# Patient Record
Sex: Female | Born: 1965 | Hispanic: Refuse to answer
Health system: Southern US, Community
[De-identification: ages and names within clinical notes are randomized; demographics above are authoritative.]

---

## 2000-10-26 ENCOUNTER — Emergency Department (HOSPITAL_COMMUNITY): Admission: EM | Admit: 2000-10-26 | Discharge: 2000-10-26 | Payer: Self-pay | Admitting: Emergency Medicine

## 2000-10-26 ENCOUNTER — Encounter: Payer: Self-pay | Admitting: Pediatrics

## 2003-04-04 ENCOUNTER — Inpatient Hospital Stay (HOSPITAL_COMMUNITY): Admission: AD | Admit: 2003-04-04 | Discharge: 2003-04-04 | Payer: Self-pay | Admitting: Obstetrics and Gynecology

## 2003-04-15 ENCOUNTER — Encounter: Admission: RE | Admit: 2003-04-15 | Discharge: 2003-04-15 | Payer: Self-pay | Admitting: Obstetrics & Gynecology

## 2004-07-04 ENCOUNTER — Emergency Department (HOSPITAL_COMMUNITY): Admission: AC | Admit: 2004-07-04 | Discharge: 2004-07-04 | Payer: Self-pay | Admitting: Emergency Medicine

## 2004-07-13 ENCOUNTER — Encounter: Admission: RE | Admit: 2004-07-13 | Discharge: 2004-07-13 | Payer: Self-pay | Admitting: Family Medicine

## 2006-05-17 ENCOUNTER — Other Ambulatory Visit: Admission: RE | Admit: 2006-05-17 | Discharge: 2006-05-17 | Payer: Self-pay | Admitting: Obstetrics and Gynecology

## 2006-05-19 ENCOUNTER — Encounter: Admission: RE | Admit: 2006-05-19 | Discharge: 2006-05-19 | Payer: Self-pay | Admitting: Obstetrics and Gynecology

## 2006-09-10 ENCOUNTER — Inpatient Hospital Stay (HOSPITAL_COMMUNITY): Admission: RE | Admit: 2006-09-10 | Discharge: 2006-09-14 | Payer: Self-pay | Admitting: Obstetrics and Gynecology

## 2006-09-11 ENCOUNTER — Encounter (INDEPENDENT_AMBULATORY_CARE_PROVIDER_SITE_OTHER): Payer: Self-pay | Admitting: Obstetrics and Gynecology

## 2009-07-27 ENCOUNTER — Emergency Department (HOSPITAL_BASED_OUTPATIENT_CLINIC_OR_DEPARTMENT_OTHER): Admission: EM | Admit: 2009-07-27 | Discharge: 2009-07-28 | Payer: Self-pay | Admitting: Emergency Medicine

## 2009-07-28 ENCOUNTER — Ambulatory Visit: Payer: Self-pay | Admitting: Diagnostic Radiology

## 2010-03-14 ENCOUNTER — Encounter: Payer: Self-pay | Admitting: Obstetrics and Gynecology

## 2010-05-10 LAB — URINALYSIS, ROUTINE W REFLEX MICROSCOPIC
Hgb urine dipstick: NEGATIVE
Nitrite: NEGATIVE
Specific Gravity, Urine: 1.033 — ABNORMAL HIGH (ref 1.005–1.030)
Urobilinogen, UA: 1 mg/dL (ref 0.0–1.0)
pH: 6 (ref 5.0–8.0)

## 2010-05-10 LAB — POCT B-TYPE NATRIURETIC PEPTIDE (BNP): B Natriuretic Peptide, POC: <5 pg/mL (ref 0–100)

## 2010-05-10 LAB — BASIC METABOLIC PANEL
BUN: 16 mg/dL (ref 6–23)
CO2: 25 mEq/L (ref 19–32)
Chloride: 105 mEq/L (ref 96–112)
Creatinine, Ser: 0.8 mg/dL (ref 0.4–1.2)
Glucose, Bld: 111 mg/dL — ABNORMAL HIGH (ref 70–99)

## 2010-05-10 LAB — CBC
MCHC: 33.2 g/dL (ref 30.0–36.0)
MCV: 89.1 fL (ref 78.0–100.0)
Platelets: 261 10*3/uL (ref 150–400)
RDW: 14.2 % (ref 11.5–15.5)

## 2010-05-10 LAB — URINE MICROSCOPIC-ADD ON

## 2010-05-10 LAB — DIFFERENTIAL
Basophils Absolute: 0.1 10*3/uL (ref 0.0–0.1)
Basophils Relative: 1 % (ref 0–1)
Eosinophils Absolute: 0.1 10*3/uL (ref 0.0–0.7)
Monocytes Relative: 11 % (ref 3–12)
Neutrophils Relative %: 74 % (ref 43–77)

## 2010-05-10 LAB — POCT CARDIAC MARKERS
CKMB, poc: <1 ng/mL — ABNORMAL LOW (ref 1.0–8.0)
CKMB, poc: <1 ng/mL — ABNORMAL LOW (ref 1.0–8.0)
Troponin i, poc: <0.05 ng/mL (ref 0.00–0.09)

## 2010-05-10 LAB — URINE CULTURE

## 2010-07-06 NOTE — H&P (Signed)
Kayla Mccormick, Kayla Mccormick               ACCOUNT NO.:  [id number]   MEDICAL RECORD NO.:  [id number]          PATIENT TYPE:  AMB   LOCATION:  SDC                           [facility]ACILITY:  [facility]   PHYSICIAN:  Gerald Leitz, MD          DATE OF BIRTH:  1965-04-28   DATE OF ADMISSION:  09/10/2006  DATE OF DISCHARGE:                              HISTORY & PHYSICAL   HISTORY OF PRESENT ILLNESS:  This is a 45 year old G3, P3 with  symptomatic uterine fibroids and anemia.  She had a hemoglobin checked  on September 07, 2006 that was 7.3.  She desires management of her fibroids  via total abdominal hysterectomy.   PAST OB HISTORY:  Spontaneous vaginal delivery x2.  Cesarean section x1.   GYN HISTORY:  STDs; HSV2. Last Pap smear was March 2008.  This showed  low grade squamous intraepithelial lesion. She underwent a colposcopy  with benign endocervical curettings, no biopsy was taken.   PAST MEDICAL HISTORY:  Uterine fibroids and anemia.   PAST SURGICAL HISTORY:  C-section x1, abdominal hernia repair and  oophorectomy.   MEDICATIONS:  The patient is noncompliant with iron sulfate.   ALLERGIES:  No known drug allergies.   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  She denies  tobacco, alcohol or illicit drug use.   FAMILY HISTORY:  Father with leukemia. Negative for breast, ovarian,  colon cancer.   REVIEW OF SYSTEMS:  Negative except as stated in history of current  illness.   PHYSICAL EXAM:  VITAL SIGNS: Blood pressure 132/74, weight 134 pounds.  GENERAL:  Alert and oriented, no acute distress.  CARDIOVASCULAR: Regular rate and rhythm.  LUNGS: Clear to auscultation bilaterally.  ABDOMEN:  Soft and nontender, nondistended.  Positive bowel sounds.  EXTREMITIES: No clubbing, cyanosis or edema.  PELVIC:  Normal external female genitalia. No vulvar or vaginal,  cervical lesions are noted.  Bimanual exam reveals approximately 18-week  size uterus.  No adnexal masses or tenderness.   IMPRESSION AND PLAN:  Symptomatic uterine fibroids and anemia.   PLAN:  Total abdominal hysterectomy.  The patient will be admitted on  September 10, 2006 for transfusion prior to surgery on the 21st. Risks,  benefits, alternatives of surgery and transfusion were discussed with  the patient including  but not limited to infection, bleeding, damage to bowel, bladder or  surrounding organs with the need for further surgery, risk of  transfusion, HIV, hepatitis B and C were discussed.  The patient  understands risks desires to proceed with total abdominal hysterectomy.      Gerald Leitz, MD  Electronically Signed     TC/MEDQ  D:  09/07/2006  T:  09/07/2006  Job:  [id number]

## 2010-07-06 NOTE — Op Note (Signed)
NAMEOBERIA, BEAUDOIN NO.:  [id number]   MEDICAL RECORD NO.:  [id number]          PATIENT TYPE:  INP   LOCATION:  9319                          FACILITY:  [facility]   PHYSICIAN:  Gerald Leitz, MD          DATE OF BIRTH:  10/25/1965   DATE OF PROCEDURE:  09/11/2006  DATE OF DISCHARGE:                               OPERATIVE REPORT   PREOPERATIVE DIAGNOSIS:  Symptomatic uterine fibroids.   POSTOPERATIVE DIAGNOSIS:  Symptomatic uterine fibroids plus enterocele.   PROCEDURE:  Total abdominal hysterectomy and enterocele repair.   SURGEON:  Gerald Leitz, M.D.   ASSISTANT:  Bing Neighbors. Sydnee Cabal, M.D.   ANESTHESIA:  General.   SPECIMENS:  Uterus sent to pathology.   ESTIMATED BLOOD LOSS:  150 mL.   FLUIDS:  2000 mL of lactated Ringer's.   URINE OUTPUT:  400 mL of clear urine at the end of the procedure.   COMPLICATIONS:  None.   FINDINGS:  Large 18 weeks size fibroid uterus. The right ovary fallopian  tube appeared normal.  The left ovary was absent.   PROCEDURE:  The patient taken to the operating room where she was placed  under general anesthesia. She was prepped and draped in the usual  sterile fashion and midline incision was made with scalpel, carried down  to the underlying fascia.  The fascia was incised and this incision was  extended superiorly inferiorly with Mayo scissors. Peritoneum was  identified and entered sharply with Metzenbaum scissors.  The incision  was extended superiorly with good visualization of the bladder.  The  abdomen was examined with findings noted above.  Balfour retractor was  placed into the abdominal cavity.  Bowel packed away with moist  laparotomy sponges.  The cornu the uterus were grasped with Kelly clamps  and used for retraction.  The round ligaments were suture ligated with 0  Vicryl bilaterally and then transected with electrocautery.  This  incision was extended to the midline with Metzenbaum scissors to create  a bladder  flap on the right. The utero-ovarian ligament was clamped  doubly clamped, transected, suture ligated with 0 Vicryl then  infundibulopelvic ligament on the left was identified, clamped,  transected, suture ligated with 0 Vicryl. Uterine arteries were  skeletonized.  A bladder flap was dissected off the lower uterine  segment and cervix with Metzenbaum scissors and a sponge stick. Uterine  artery on the right was clamped with Heaney clamp, transected, suture  ligated with 0 Vicryl.  This was repeated on the left uterine artery.  The cardinal ligament was clamped with Heaney clamps, transected, suture  ligated with 0 Vicryl.  Uterosacrals were clamped with Heaney clamps,  transected, suture ligated with 0 Vicryl. The uterus and cervix  amputated with Jorgenson scissors.  The vaginal cuff angles were  repaired with 0 Vicryl.  The vaginal cuff was reapproximated with a  running stitch of 0 Vicryl.  The abdomen was copiously irrigated.  The  patient was noted to have some bleeding at the area of bladder  dissection.  This was made hemostatic with interrupted suture of  2-0  Vicryl. The abdomen was then copiously irrigated.  Excellent hemostasis  was noted. All instruments were removed from the abdomen. Fascia was  reapproximated with 0 PDS. The skin was closed with staples.  Sponge,  lap, needle counts were correct x2.  The patient was given 2 grams of  Ancef prior to the surgery.  She wakened from anesthesia, taken to  recovery room awake and stable condition.      Gerald Leitz, MD  Electronically Signed     TC/MEDQ  D:  09/11/2006  T:  09/11/2006  Job:  [id number]

## 2010-07-09 NOTE — Group Therapy Note (Signed)
Kayla Mccormick, Kayla Mccormick                         ACCOUNT NO.:  [id number]   MEDICAL RECORD NO.:  [id number]                   PATIENT TYPE:  OUT   LOCATION:  [facility]                           FACILITY:  [facility]   PHYSICIAN:  Elsie Lincoln, MD                   DATE OF BIRTH:  August 12, 1965   DATE OF SERVICE:  04/15/2003                                    CLINIC NOTE   REASON FOR VISIT:  The patient is a 45 year old G3 P3-0-0-3 who presents for  follow-up from [facility].  The patient was seen at an urgent care center early  February and had a Bartholin's abscess drained.  She returned to the [facility] on  April 04, 2003 with reaccumulation of the abscess.  A Ward catheter was  placed and the patient has steadily gotten better since then.  She was  placed on doxycycline and has 1 day left of this.   PAST MEDICAL HISTORY:  Denies.   PAST SURGICAL HISTORY:  1. Two ovarian cystectomies - one in 1981 and one in a year that she cannot     remember when.  2. A hernia repair in 1997.  3. A C-section in 1997 with a concomitant BTL.   GYNECOLOGICAL HISTORY:  Two SVDs and one C-section and one BTL.  Last Pap  smear February 2003 at Dr. Elliot Gault' office.  She no longer has a GYN since  Dr. Elliot Gault has left the community.   SOCIAL HISTORY:  No smoking, drugs, or alcohol.   ALLERGIES:  No known drug allergies.   REVIEW OF SYSTEMS:  No chest pain, shortness of breath, nausea, vomiting,  diarrhea, fevers, or perineal pain.   PHYSICAL EXAMINATION:  Temperature 98.7, pulse 89, blood pressure 114/76,  weight 140.2.  External genitalia:  Tanner V.  A Ward catheter is in place  in the left labia with the drain against the vagina.  There is no erythema,  pus, or tenderness in this area.   ASSESSMENT AND PLAN:  43. A 45 year old female, last menstrual period April 07, 2003 for follow-     up of Bartholin's gland abscess.  The Bartholin's abscess is healing     well.  Will wait between 4-6 weeks before pulling  the catheter.     Hopefully it will just fall out at the point it becomes epithelialized.     The patient needs to come back in 3 weeks for evaluation.  2. The patient needs to find a permanent GYN.  She does not want to have     follow-up care here for her Pap smear and annual exams.  The patient was     told that she needs a Pap smear this year as it has been since 2003 since     her last Pap smear.  Elsie Lincoln, MD   KL/MEDQ  D:  04/15/2003  T:  04/15/2003  Job:  [id number]

## 2010-07-09 NOTE — Discharge Summary (Signed)
[name], Kayla Mccormick               ACCOUNT NO.:  [id number]   MEDICAL RECORD NO.:  [id number]          PATIENT TYPE:  INP   LOCATION:  9319                          [facility]ILITY[facility]   PHYSICIAN:  Gerald Leitz, MD          DATE OF BIRTH:  08-Apr-1965   DATE OF ADMISSION:  09/10/2006  DATE OF DISCHARGE:  09/14/2006                               DISCHARGE SUMMARY   ADMISSION DIAGNOSIS:  1. Anemia.  2. Uterine fibroids   BRIEF HOSPITAL COURSE:  The patient was admitted on September 10, 2006 with  anemia of hemoglobin was 7.3.  She received 2 units packed red blood  cells.  Her post transfusion hematocrit and hemoglobin were 31.7 and 9.8  respectively.  She underwent a total abdominal hysterectomy on September 11, 2006. She did well postoperatively. Hemoglobin on postop day #1 was  10.1, hematocrit of 32.6. She is discharged on postop day #3 with return  of bladder function.  She will follow up with Dr. Richardson Dopp in 4-6 weeks.   MEDICATIONS AT DISCHARGE:  Motrin, Percocet.   DISCHARGE DIAGNOSIS:  Anemia, uterine fibroids, status post total  abdominal hysterectomy.      Gerald Leitz, MD  Electronically Signed     TC/MEDQ  D:  10/10/2006  T:  10/10/2006  Job:  [id number]

## 2010-11-28 IMAGING — CR DG CHEST 2V
2 series · 2 of 2 positions shown · non-contrast
Comparison: None.

CLINICAL DATA: Left-sided chest pressure with tingling.

CHEST - 2 VIEW

[w chest pa]
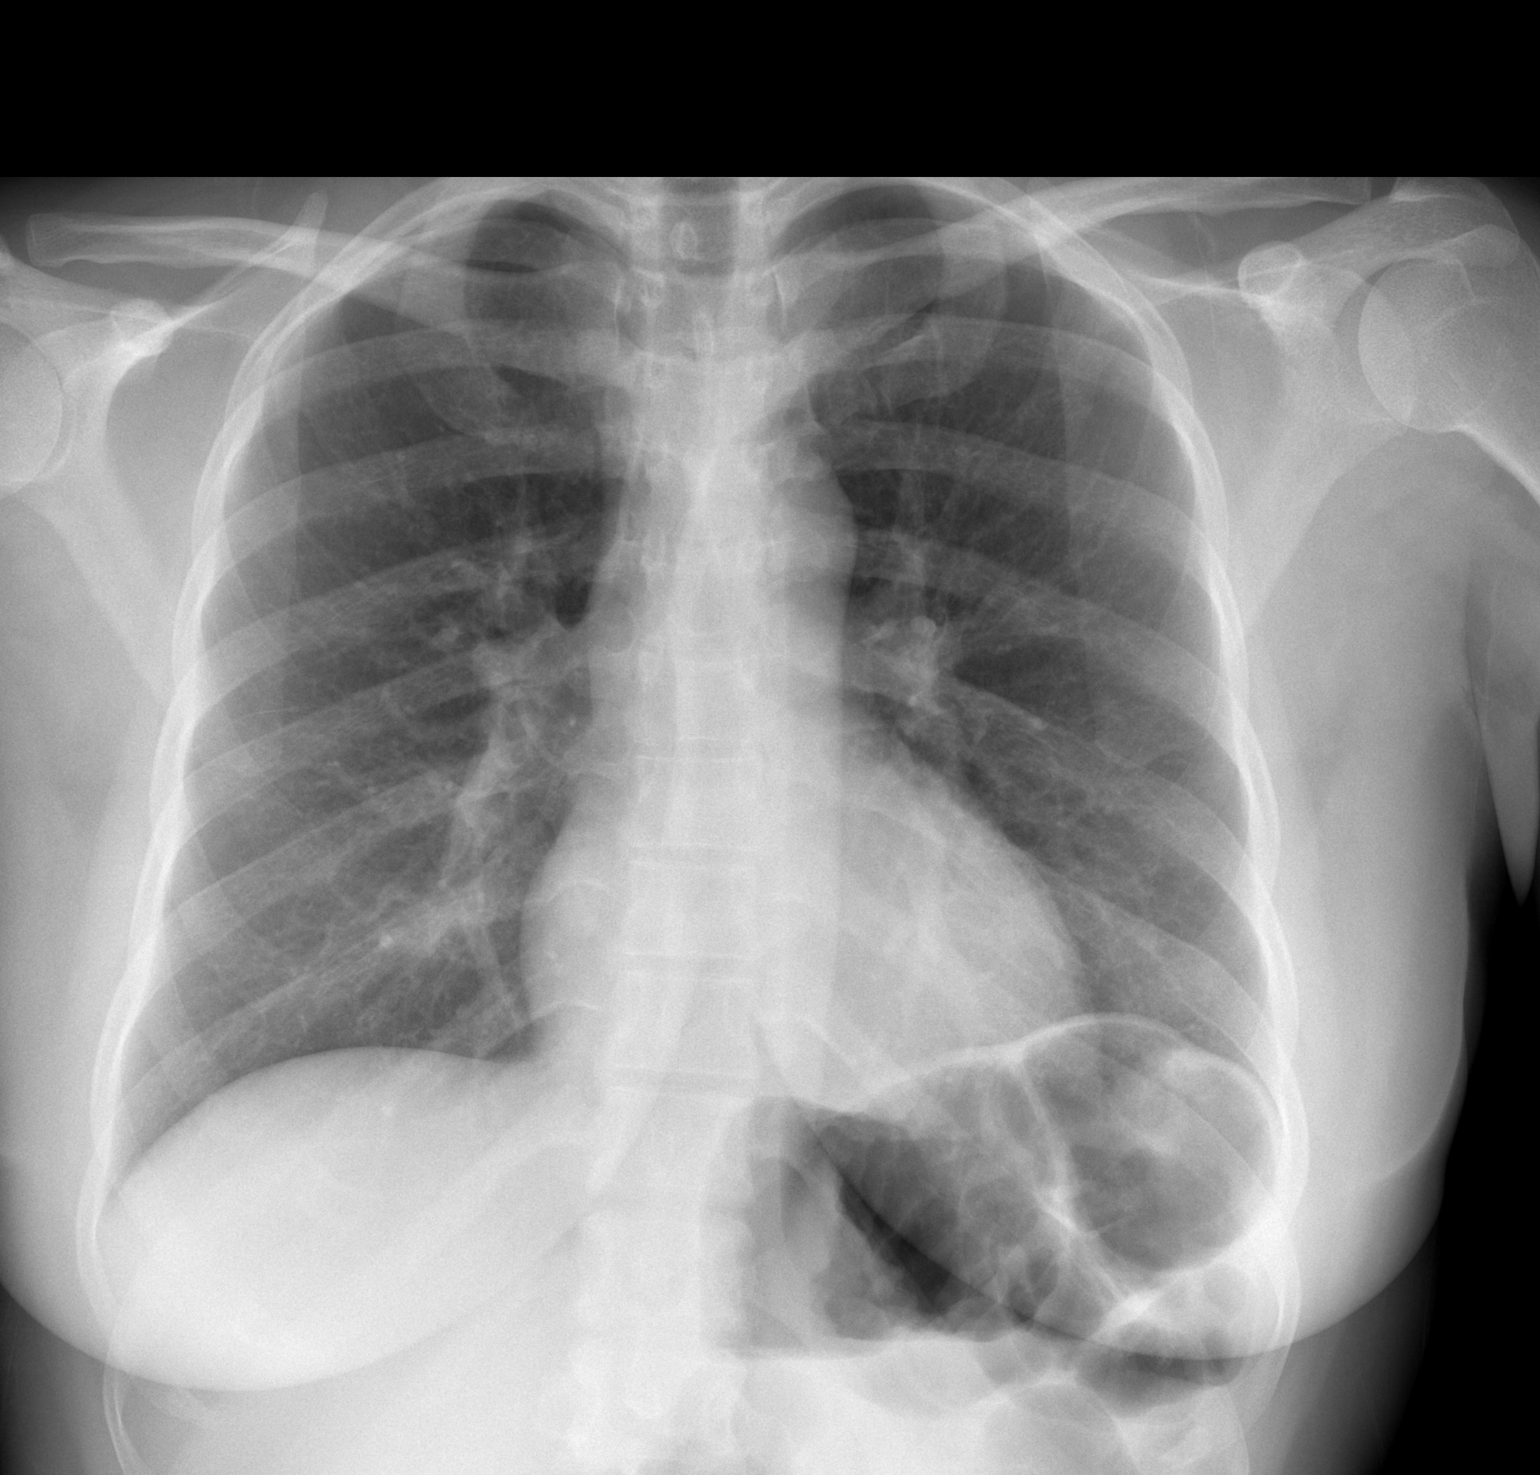

[w chest lat]
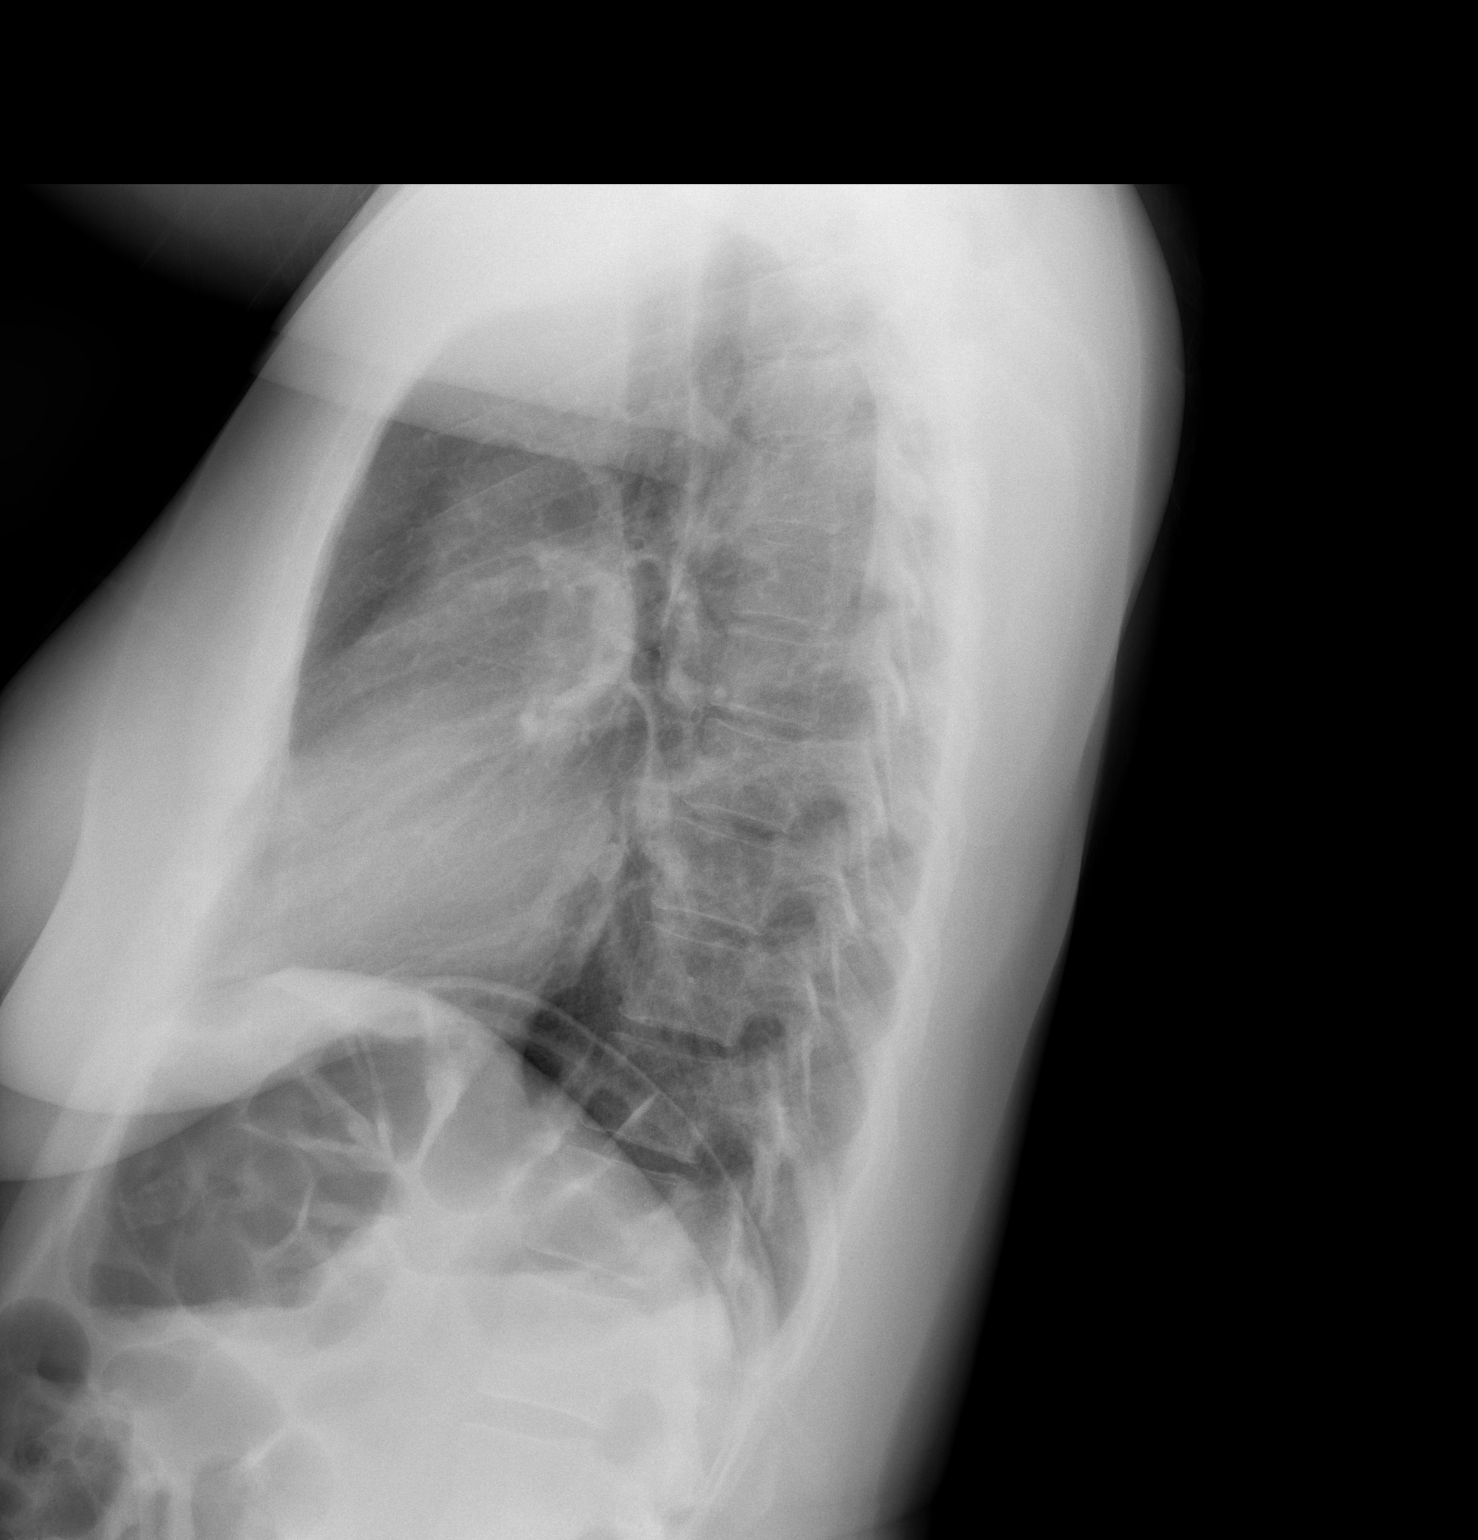

[2 of 2 positions shown; findings below may reference images not displayed]

FINDINGS: Midline trachea.  Normal heart size and mediastinal
contours. No pleural effusion or pneumothorax.
Clear lungs.
IMPRESSION: No acute cardiopulmonary disease.

## 2010-11-28 IMAGING — CT CT HEAD W/O CM
1 series · 16 of 30 positions shown, 20 images · non-contrast
Comparison: Head CT [DATE] of six.

CLINICAL DATA: Headache with tingling in both hands.

CT HEAD WITHOUT CONTRAST
TECHNIQUE: Contiguous axial images were obtained from the base of
the skull through the vertex without contrast.

[Series 2: head 4.8 h37s · axial · 0.42mm/px · z∈[+1104,+1237]mm · 16 of 32 slices shown, 20 images]
[im 2/32  brain]
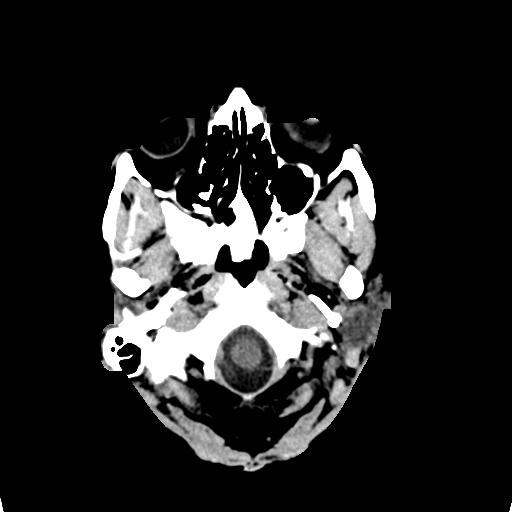
[im 2/32  bone]
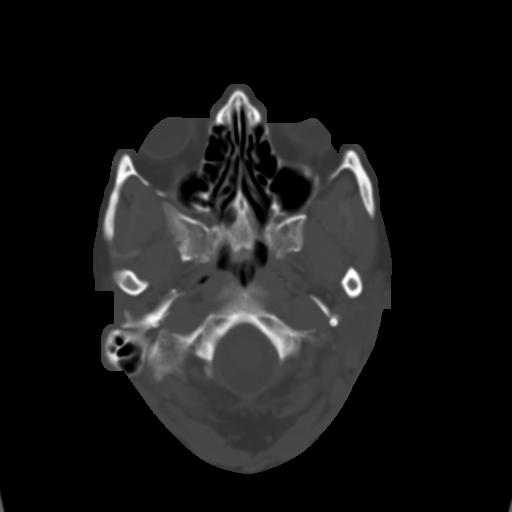
[im 4/32  brain]
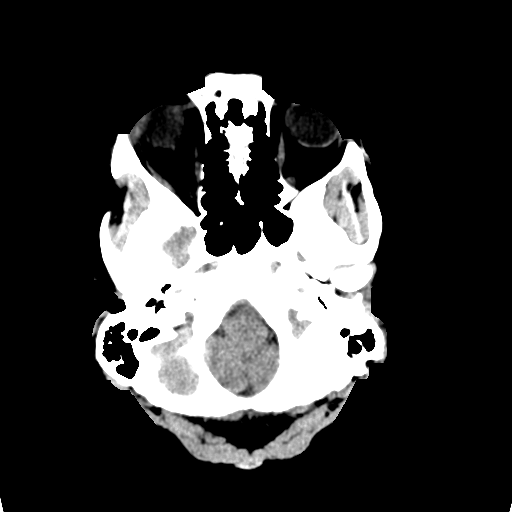
[im 6/32  brain]
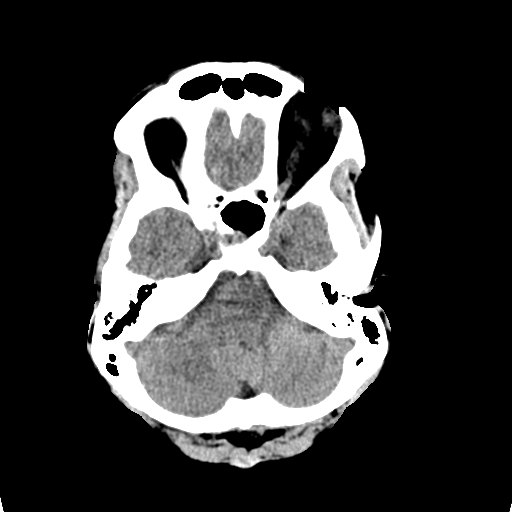
[im 8/32  brain]
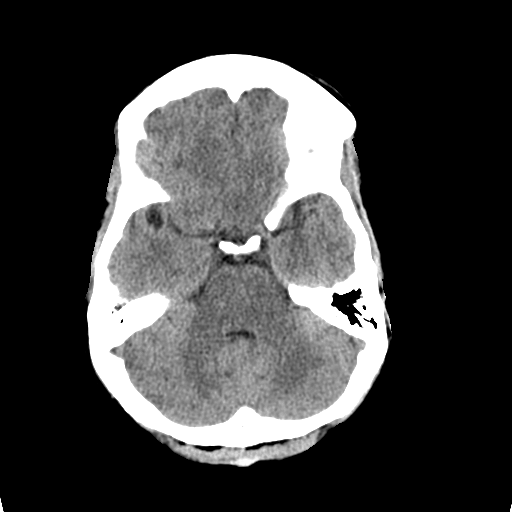
[im 9/32  brain]
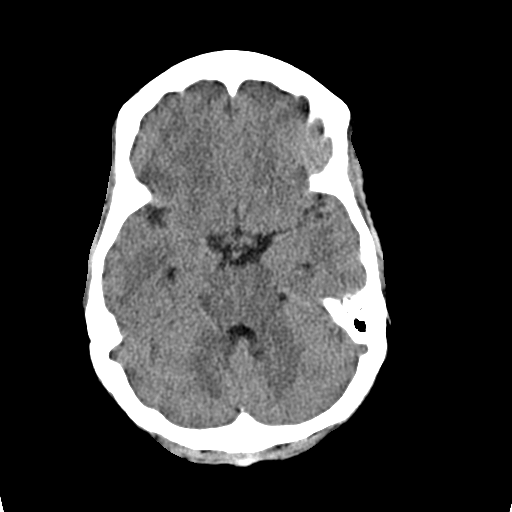
[im 9/32  bone]
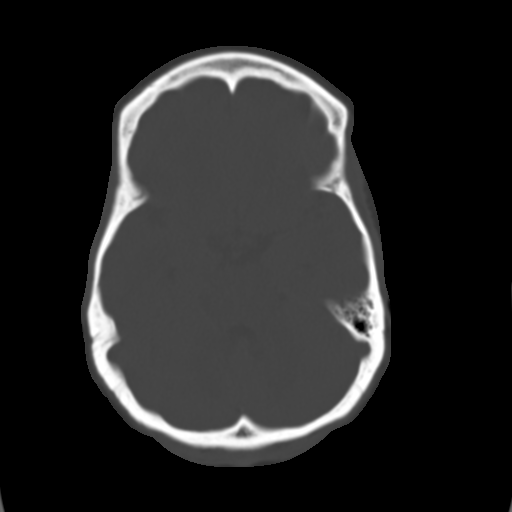
[im 11/32  brain]
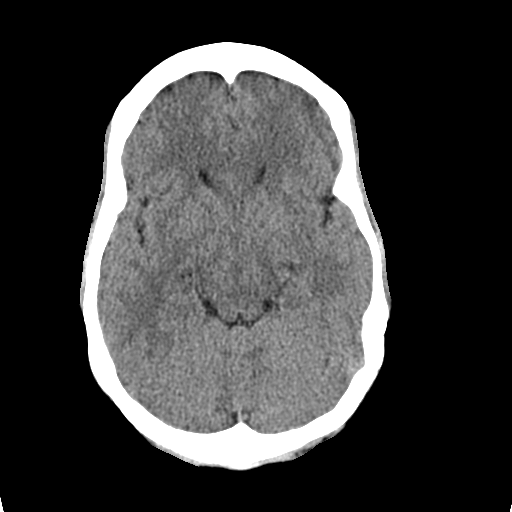
[im 13/32  brain]
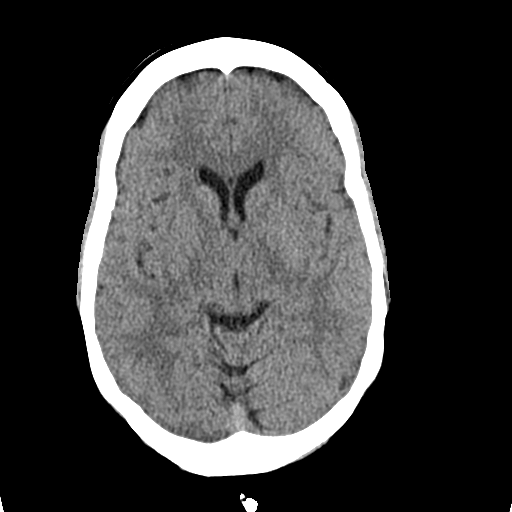
[im 15/32  brain]
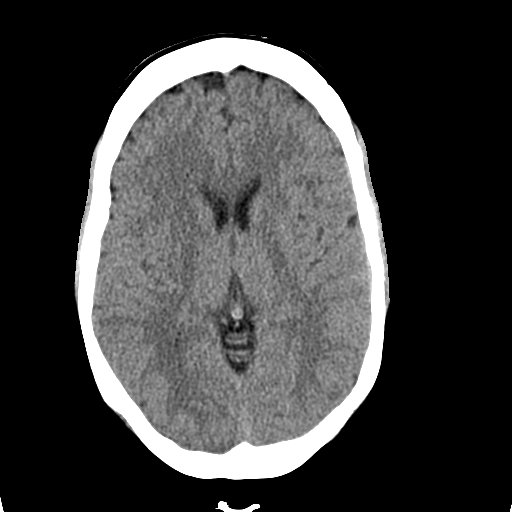
[im 17/32  brain]
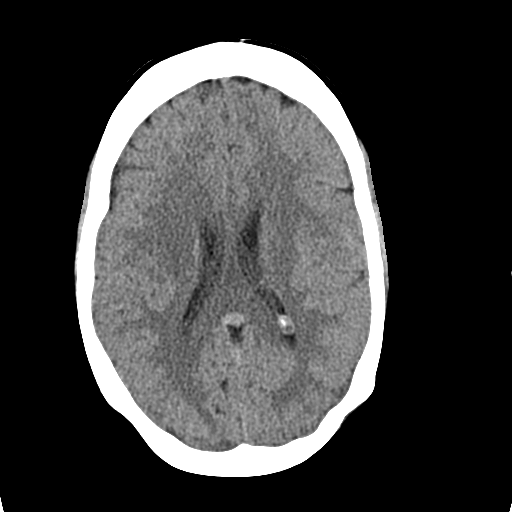
[im 17/32  bone]
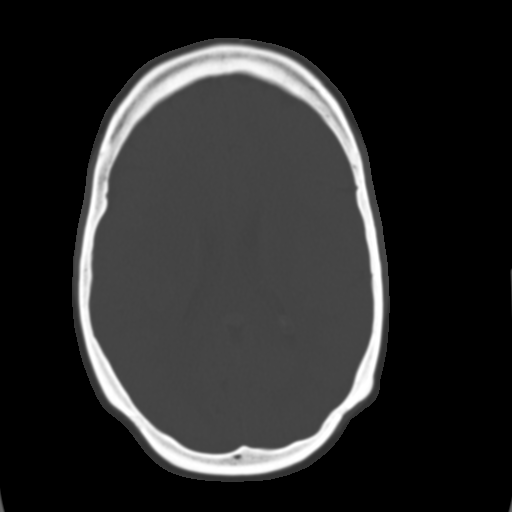
[im 19/32  brain]
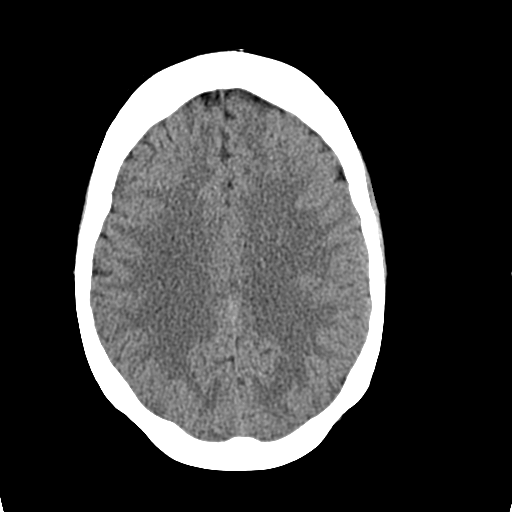
[im 21/32  brain]
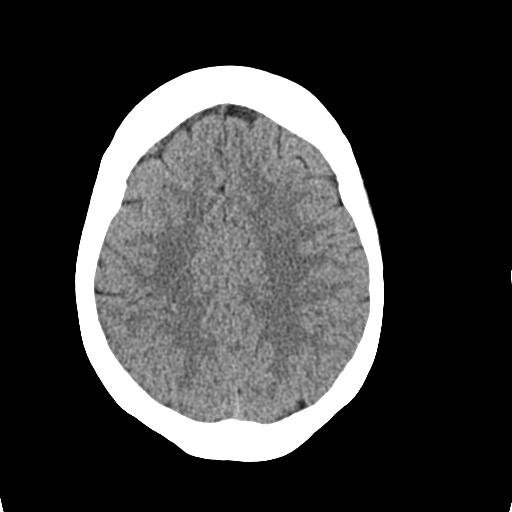
[im 23/32  brain]
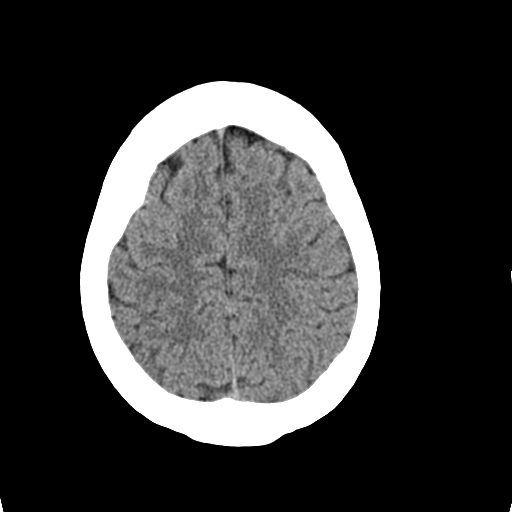
[im 24/32  brain]
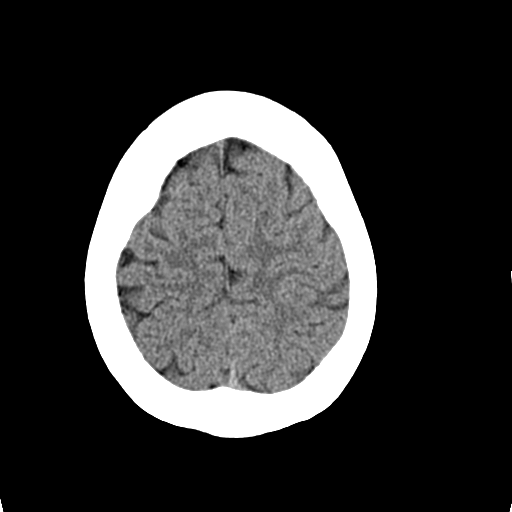
[im 24/32  bone]
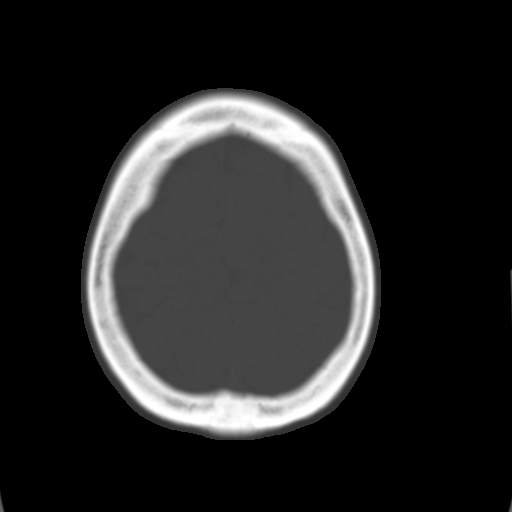
[im 26/32  brain]
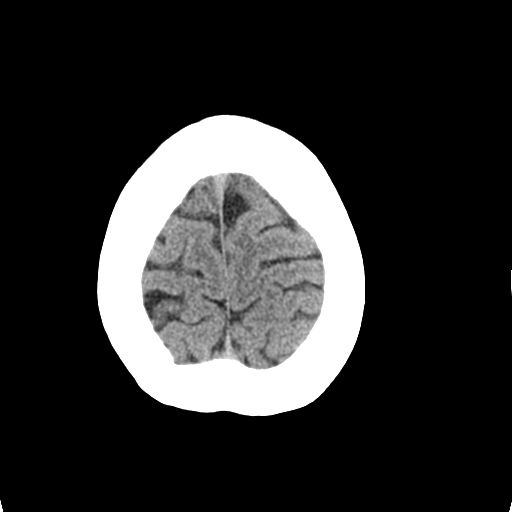
[im 28/32  brain]
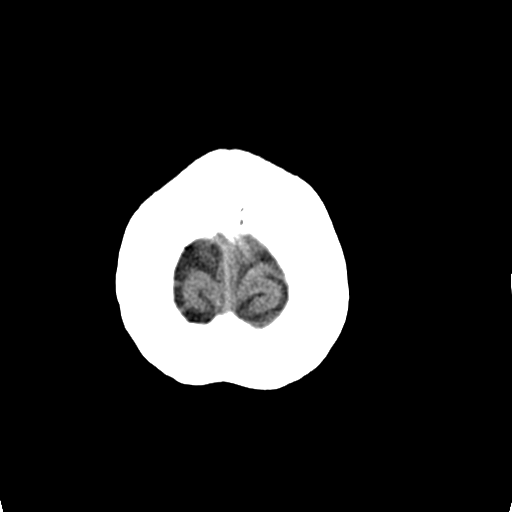
[im 30/32  brain]
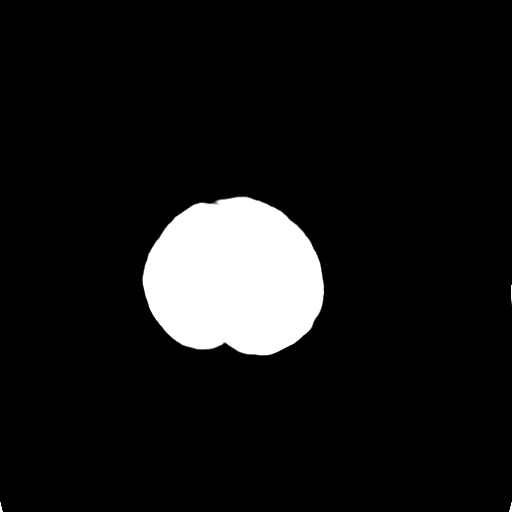

[16 of 30 positions shown; findings below may reference images not displayed]

FINDINGS: There is no evidence of acute intracranial hemorrhage,
mass lesion, brain edema or extra-axial fluid collection.  The
ventricles and subarachnoid spaces are appropriately sized for age.
There is no CT evidence of acute cortical infarction.

The visualized paranasal sinuses are clear. The calvarium is
intact. Small radiodensities are again noted within the frontal
scalp soft tissues, decreased in size and number from the prior
study.
IMPRESSION: No acute intracranial findings.

## 2010-12-06 LAB — URINALYSIS, ROUTINE W REFLEX MICROSCOPIC
Bilirubin Urine: NEGATIVE
Hgb urine dipstick: NEGATIVE
Protein, ur: NEGATIVE
Urobilinogen, UA: 0.2

## 2010-12-06 LAB — CBC
Hemoglobin: 10.1 — ABNORMAL LOW
Hemoglobin: 7.3 — CL
Hemoglobin: 9.8 — ABNORMAL LOW
MCHC: 30.5
MCHC: 31.1
MCV: 67.9 — ABNORMAL LOW
RBC: 4.68
RBC: 4.81
RDW: 20.3 — ABNORMAL HIGH
WBC: 3.4 — ABNORMAL LOW

## 2010-12-06 LAB — ABO/RH: ABO/RH(D): A POS

## 2010-12-06 LAB — CROSSMATCH: Antibody Screen: NEGATIVE

## 2010-12-06 LAB — URINE MICROSCOPIC-ADD ON

## 2019-05-16 ENCOUNTER — Ambulatory Visit: Payer: Self-pay | Attending: Internal Medicine

## 2019-05-16 DIAGNOSIS — Z23 Encounter for immunization: Secondary | ICD-10-CM

## 2019-05-16 NOTE — Progress Notes (Signed)
   Covid-19 Vaccination Clinic  Name:  CARREEN MILIUS    MRN: [id number] DOB: 02-14-1966  05/16/2019  Ms. Heiberger was observed post Covid-19 immunization for 15 minutes without incident. She was provided with Vaccine Information Sheet and instruction to access the V-Safe system.   Ms. Takach was instructed to call 911 with any severe reactions post vaccine: Marland Kitchen Difficulty breathing  . Swelling of face and throat  . A fast heartbeat  . A bad rash all over body  . Dizziness and weakness   Immunizations Administered    Name Date Dose VIS Date Route   Pfizer COVID-19 Vaccine 05/16/2019  2:45 PM 0.3 mL 02/01/2019 Intramuscular   Manufacturer: [employer], [employer]   Lot: [id number]   NDC: [address]-6

## 2019-06-10 ENCOUNTER — Ambulatory Visit: Payer: Self-pay | Attending: Internal Medicine

## 2019-06-10 DIAGNOSIS — Z23 Encounter for immunization: Secondary | ICD-10-CM

## 2019-06-10 NOTE — Progress Notes (Signed)
   Covid-19 Vaccination Clinic  Name:  PEACE NOYES    MRN: [id number] DOB: [birth year]  06/10/2019  Ms. Buys was observed post Covid-19 immunization for 15 minutes without incident. She was provided with Vaccine Information Sheet and instruction to access the V-Safe system.   Ms. Kawamoto was instructed to call 911 with any severe reactions post vaccine: Marland Kitchen Difficulty breathing  . Swelling of face and throat  . A fast heartbeat  . A bad rash all over body  . Dizziness and weakness   Immunizations Administered    Name Date Dose VIS Date Route   Pfizer COVID-19 Vaccine 06/10/2019 12:52 PM 0.3 mL 04/17/2018 Intramuscular   Manufacturer: [employer], [employer]   Lot: [id number]   NDC: [address]-5
